# Patient Record
Sex: Male | Born: 1950
Health system: Southern US, Community
[De-identification: ages and names within clinical notes are randomized; demographics above are authoritative.]

## PROBLEM LIST (undated history)

## (undated) DIAGNOSIS — E119 Type 2 diabetes mellitus without complications: Secondary | ICD-10-CM

## (undated) DIAGNOSIS — I10 Essential (primary) hypertension: Secondary | ICD-10-CM

## (undated) DIAGNOSIS — R0600 Dyspnea, unspecified: Secondary | ICD-10-CM

## (undated) HISTORY — PX: BACK SURGERY: SHX140

## (undated) HISTORY — PX: HAND NERVE REPAIR: SHX1728

## (undated) HISTORY — PX: EYE SURGERY: SHX253

---

## 2017-11-29 DIAGNOSIS — Z8669 Personal history of other diseases of the nervous system and sense organs: Secondary | ICD-10-CM | POA: Diagnosis not present

## 2017-11-29 DIAGNOSIS — E785 Hyperlipidemia, unspecified: Secondary | ICD-10-CM | POA: Diagnosis not present

## 2017-11-29 DIAGNOSIS — Z23 Encounter for immunization: Secondary | ICD-10-CM | POA: Diagnosis not present

## 2017-11-29 DIAGNOSIS — I10 Essential (primary) hypertension: Secondary | ICD-10-CM | POA: Diagnosis not present

## 2017-11-29 DIAGNOSIS — E1169 Type 2 diabetes mellitus with other specified complication: Secondary | ICD-10-CM | POA: Diagnosis not present

## 2018-03-06 DIAGNOSIS — D485 Neoplasm of uncertain behavior of skin: Secondary | ICD-10-CM | POA: Diagnosis not present

## 2018-03-06 DIAGNOSIS — M5416 Radiculopathy, lumbar region: Secondary | ICD-10-CM | POA: Diagnosis not present

## 2018-03-06 DIAGNOSIS — M5136 Other intervertebral disc degeneration, lumbar region: Secondary | ICD-10-CM | POA: Diagnosis not present

## 2018-03-06 DIAGNOSIS — M62830 Muscle spasm of back: Secondary | ICD-10-CM | POA: Diagnosis not present

## 2018-03-11 DIAGNOSIS — E119 Type 2 diabetes mellitus without complications: Secondary | ICD-10-CM | POA: Diagnosis not present

## 2018-03-11 DIAGNOSIS — H524 Presbyopia: Secondary | ICD-10-CM | POA: Diagnosis not present

## 2018-04-07 DIAGNOSIS — M5416 Radiculopathy, lumbar region: Secondary | ICD-10-CM | POA: Diagnosis not present

## 2018-04-07 DIAGNOSIS — M5136 Other intervertebral disc degeneration, lumbar region: Secondary | ICD-10-CM | POA: Diagnosis not present

## 2018-04-07 DIAGNOSIS — M62830 Muscle spasm of back: Secondary | ICD-10-CM | POA: Diagnosis not present

## 2018-04-07 DIAGNOSIS — E1169 Type 2 diabetes mellitus with other specified complication: Secondary | ICD-10-CM | POA: Diagnosis not present

## 2018-05-28 DIAGNOSIS — M545 Low back pain: Secondary | ICD-10-CM | POA: Diagnosis not present

## 2018-05-28 DIAGNOSIS — M5136 Other intervertebral disc degeneration, lumbar region: Secondary | ICD-10-CM | POA: Diagnosis not present

## 2018-06-05 DIAGNOSIS — M5416 Radiculopathy, lumbar region: Secondary | ICD-10-CM | POA: Diagnosis not present

## 2018-06-11 DIAGNOSIS — M5416 Radiculopathy, lumbar region: Secondary | ICD-10-CM | POA: Diagnosis not present

## 2018-06-11 DIAGNOSIS — M545 Low back pain: Secondary | ICD-10-CM | POA: Diagnosis not present

## 2018-06-11 DIAGNOSIS — Z135 Encounter for screening for eye and ear disorders: Secondary | ICD-10-CM | POA: Diagnosis not present

## 2018-06-18 DIAGNOSIS — I10 Essential (primary) hypertension: Secondary | ICD-10-CM | POA: Diagnosis not present

## 2018-06-18 DIAGNOSIS — M5416 Radiculopathy, lumbar region: Secondary | ICD-10-CM | POA: Diagnosis not present

## 2018-06-18 DIAGNOSIS — Z6834 Body mass index (BMI) 34.0-34.9, adult: Secondary | ICD-10-CM | POA: Diagnosis not present

## 2018-06-20 ENCOUNTER — Other Ambulatory Visit: Payer: Self-pay | Admitting: Neurological Surgery

## 2018-07-04 DIAGNOSIS — M5416 Radiculopathy, lumbar region: Secondary | ICD-10-CM | POA: Diagnosis not present

## 2018-07-09 NOTE — Progress Notes (Signed)
[id number] - [place], [place] [address] [place] [address] Phone: [phone] Fax: [phone]      Your procedure is scheduled on Monday, 07/14/2018.  Report to [facility] Main Entrance "A" at 10:00 A.M., and check in at the Admitting office.  Call this number if you have problems the morning of surgery:  [phone]  Call [phone] if you have any questions prior to your surgery date Monday-Friday 8am-4pm    Remember:  Do not eat or drink after midnight the night before your surgery     Take these medicines the morning of surgery with A SIP OF WATER: Acetaminophen (Tylenol) - if needed   7 days prior to surgery STOP taking any Aspirin (unless otherwise instructed by your surgeon), Aleve, Naproxen, Ibuprofen, Motrin, Advil, Goody's, BC's, all herbal medications, fish oil, and all vitamins.  WHAT DO I DO ABOUT MY DIABETES MEDICATION?  Marland Kitchen Do not take oral diabetes medicines (pills) the morning of surgery. - Do NOT take your metformin the morning of surgery   How to Manage Your Diabetes Before and After Surgery  Why is it important to control my blood sugar before and after surgery? . Improving blood sugar levels before and after surgery helps healing and can limit problems. . A way of improving blood sugar control is eating a healthy diet by: o  Eating less sugar and carbohydrates o  Increasing activity/exercise o  Talking with your doctor about reaching your blood sugar goals . High blood sugars (greater than 180 mg/dL) can raise your risk of infections and slow your recovery, so you will need to focus on controlling your diabetes during the weeks before surgery. . Make sure that the doctor who takes care of your diabetes knows about your planned surgery including the date and location.  How do I manage my blood sugar before surgery? . Check your blood sugar at least 4 times a day, starting 2 days before  surgery, to make sure that the level is not too high or low. o Check your blood sugar the morning of your surgery when you wake up and every 2 hours until you get to the Short Stay unit. . If your blood sugar is less than 70 mg/dL, you will need to treat for low blood sugar: o Do not take insulin. o Treat a low blood sugar (less than 70 mg/dL) with  cup of clear juice (cranberry or apple), 4 glucose tablets, OR glucose gel. o Recheck blood sugar in 15 minutes after treatment (to make sure it is greater than 70 mg/dL). If your blood sugar is not greater than 70 mg/dL on recheck, call [phone] for further instructions. . Report your blood sugar to the short stay nurse when you get to Short Stay.  . If you are admitted to the hospital after surgery: o Your blood sugar will be checked by the staff and you will probably be given insulin after surgery (instead of oral diabetes medicines) to make sure you have good blood sugar levels. o The goal for blood sugar control after surgery is 80-180 mg/dL.      The Morning of Surgery  Do not wear jewelry, make-up or nail polish.  Do not wear lotions, powders, or perfumes/colognes, or deodorant  Do not shave 48 hours prior to surgery.  Men may shave face and neck.  Do not bring valuables to the hospital.  [facility] is not responsible for  any belongings or valuables.  IF you are a smoker, DO NOT Smoke 24 hours prior to surgery  IF you wear a CPAP at night please bring your mask, tubing, and machine the morning of surgery   Remember that you must have someone to transport you home after your surgery, and remain with you for 24 hours if you are discharged the same day.   Contacts, glasses, hearing aids, dentures or bridgework may not be worn into surgery.    Leave your suitcase in the car.  After surgery it may be brought to your room.  For patients admitted to the hospital, discharge time will be determined by your treatment team.  Patients  discharged the day of surgery will not be allowed to drive home.    Special instructions:   [place]- Preparing For Surgery  Before surgery, you can play an important role. Because skin is not sterile, your skin needs to be as free of germs as possible. You can reduce the number of germs on your skin by washing with CHG (chlorahexidine gluconate) Soap before surgery.  CHG is an antiseptic cleaner which kills germs and bonds with the skin to continue killing germs even after washing.    Oral Hygiene is also important to reduce your risk of infection.  Remember - BRUSH YOUR TEETH THE MORNING OF SURGERY WITH YOUR REGULAR TOOTHPASTE  Please do not use if you have an allergy to CHG or antibacterial soaps. If your skin becomes reddened/irritated stop using the CHG.  Do not shave (including legs and underarms) for at least 48 hours prior to first CHG shower. It is OK to shave your face.  Please follow these instructions carefully.   1. Shower the NIGHT BEFORE SURGERY and the MORNING OF SURGERY with CHG Soap.   2. If you chose to wash your hair, wash your hair first as usual with your normal shampoo.  3. After you shampoo, rinse your hair and body thoroughly to remove the shampoo.  4. Use CHG as you would any other liquid soap. You can apply CHG directly to the skin and wash gently with a scrungie or a clean washcloth.   5. Apply the CHG Soap to your body ONLY FROM THE NECK DOWN.  Do not use on open wounds or open sores. Avoid contact with your eyes, ears, mouth and genitals (private parts). Wash Face and genitals (private parts)  with your normal soap.   6. Wash thoroughly, paying special attention to the area where your surgery will be performed.  7. Thoroughly rinse your body with warm water from the neck down.  8. DO NOT shower/wash with your normal soap after using and rinsing off the CHG Soap.  9. Pat yourself dry with a CLEAN TOWEL.  10. Wear CLEAN PAJAMAS to bed the night before  surgery, wear comfortable clothes the morning of surgery  11. Place CLEAN SHEETS on your bed the night of your first shower and DO NOT SLEEP WITH PETS.    Day of Surgery: Do not apply any deodorants/lotions. Please shower the morning of surgery with the CHG soap  Please wear clean clothes to the hospital/surgery center.   Remember to brush your teeth WITH YOUR REGULAR TOOTHPASTE.   Please read over the following fact sheets that you were given.

## 2018-07-10 ENCOUNTER — Other Ambulatory Visit (HOSPITAL_COMMUNITY)
Admission: RE | Admit: 2018-07-10 | Discharge: 2018-07-10 | Disposition: A | Discharge status: Home / Self Care | Payer: Medicare Other | Source: Ambulatory Visit | Attending: Neurological Surgery | Admitting: Neurological Surgery

## 2018-07-10 ENCOUNTER — Other Ambulatory Visit: Payer: Self-pay

## 2018-07-10 ENCOUNTER — Encounter (HOSPITAL_COMMUNITY)
Admission: RE | Admit: 2018-07-10 | Discharge: 2018-07-10 | Disposition: A | Discharge status: Home / Self Care | Payer: Medicare Other | Source: Ambulatory Visit | Attending: Neurological Surgery | Admitting: Neurological Surgery

## 2018-07-10 ENCOUNTER — Encounter (HOSPITAL_COMMUNITY): Payer: Self-pay

## 2018-07-10 DIAGNOSIS — R9431 Abnormal electrocardiogram [ECG] [EKG]: Secondary | ICD-10-CM | POA: Insufficient documentation

## 2018-07-10 DIAGNOSIS — I444 Left anterior fascicular block: Secondary | ICD-10-CM | POA: Diagnosis not present

## 2018-07-10 DIAGNOSIS — Z1159 Encounter for screening for other viral diseases: Secondary | ICD-10-CM | POA: Insufficient documentation

## 2018-07-10 DIAGNOSIS — I10 Essential (primary) hypertension: Secondary | ICD-10-CM | POA: Insufficient documentation

## 2018-07-10 DIAGNOSIS — Z01818 Encounter for other preprocedural examination: Secondary | ICD-10-CM | POA: Diagnosis not present

## 2018-07-10 DIAGNOSIS — M5126 Other intervertebral disc displacement, lumbar region: Secondary | ICD-10-CM | POA: Diagnosis not present

## 2018-07-10 HISTORY — DX: Essential (primary) hypertension: I10

## 2018-07-10 HISTORY — DX: Type 2 diabetes mellitus without complications: E11.9

## 2018-07-10 HISTORY — DX: Dyspnea, unspecified: R06.00

## 2018-07-10 LAB — CBC
HCT: 45.2 % (ref 39.0–52.0)
Hemoglobin: 15.4 g/dL (ref 13.0–17.0)
MCH: 30.7 pg (ref 26.0–34.0)
MCHC: 34.1 g/dL (ref 30.0–36.0)
MCV: 90 fL (ref 80.0–100.0)
Platelets: 240 10*3/uL (ref 150–400)
RBC: 5.02 MIL/uL (ref 4.22–5.81)
RDW: 12.1 % (ref 11.5–15.5)
WBC: 5.1 10*3/uL (ref 4.0–10.5)
nRBC: 0 % (ref 0.0–0.2)

## 2018-07-10 LAB — BASIC METABOLIC PANEL
Anion gap: 13 (ref 5–15)
BUN: 15 mg/dL (ref 8–23)
CO2: 20 mmol/L — ABNORMAL LOW (ref 22–32)
Calcium: 9.5 mg/dL (ref 8.9–10.3)
Chloride: 106 mmol/L (ref 98–111)
Creatinine, Ser: 1.08 mg/dL (ref 0.61–1.24)
GFR calc Af Amer: >60 mL/min (ref >60)
GFR calc non Af Amer: >60 mL/min (ref >60)
Glucose, Bld: 123 mg/dL — ABNORMAL HIGH (ref 70–99)
Potassium: 4 mmol/L (ref 3.5–5.1)
Sodium: 139 mmol/L (ref 135–145)

## 2018-07-10 LAB — SARS CORONAVIRUS 2 (TAT 6-24 HRS): SARS Coronavirus 2: NEGATIVE

## 2018-07-10 LAB — SURGICAL PCR SCREEN
MRSA, PCR: NEGATIVE
Staphylococcus aureus: POSITIVE — AB

## 2018-07-10 LAB — HEMOGLOBIN A1C
Hgb A1c MFr Bld: 5.9 % — ABNORMAL HIGH (ref 4.8–5.6)
Mean Plasma Glucose: 122.63 mg/dL

## 2018-07-10 LAB — TYPE AND SCREEN
ABO/RH(D): B POS
Antibody Screen: NEGATIVE

## 2018-07-10 LAB — ABO/RH: ABO/RH(D): B POS

## 2018-07-10 LAB — GLUCOSE, CAPILLARY: Glucose-Capillary: 127 mg/dL — ABNORMAL HIGH (ref 70–99)

## 2018-07-10 MED ORDER — CHLORHEXIDINE GLUCONATE CLOTH 2 % EX PADS: 6.0000 | MEDICATED_PAD | Freq: Once | CUTANEOUS | Status: DC

## 2018-07-10 NOTE — Progress Notes (Addendum)
PCP - [address]   AT [place] Cardiologist - NA  Chest x-ray - NA EKG - 07/10/18 st -         Fasting Blood Sugar - 127 Checks Blood Sugar __0___ times a day   Aspirin Instructions:STOP  Anesthesia review: REVIEW  EKG    PT STATED HE DIDN'T SEE MD OR HAVE ANY OTHER EKGS DONE. I DID REQUEST RECORDS AND CLEARANCE,HAD TO LEAVE MESSAGE  Patient denies shortness of breath, fever, cough and chest pain at PAT appointment   Patient verbalized understanding of instructions that were given to them at the PAT appointment. Patient was also instructed that they will need to review over the PAT instructions again at home before surgery.

## 2018-07-10 NOTE — Anesthesia Preprocedure Evaluation (Addendum)
Anesthesia Evaluation  Patient identified by MRN, date of birth, ID band Patient awake    Reviewed: Allergy & Precautions, NPO status , Patient's Chart, lab work & pertinent test results  Airway Mallampati: II  TM Distance: >3 FB Neck ROM: Full    Dental no notable dental hx.    Pulmonary neg pulmonary ROS,    Pulmonary exam normal breath sounds clear to auscultation       Cardiovascular hypertension, Pt. on medications Normal cardiovascular exam Rhythm:Regular Rate:Normal     Neuro/Psych negative neurological ROS  negative psych ROS   GI/Hepatic negative GI ROS, Neg liver ROS,   Endo/Other  diabetes  Renal/GU negative Renal ROS  negative genitourinary   Musculoskeletal negative musculoskeletal ROS (+)   Abdominal   Peds negative pediatric ROS (+)  Hematology negative hematology ROS (+)   Anesthesia Other Findings   Reproductive/Obstetrics negative OB ROS                           Anesthesia Physical Anesthesia Plan  ASA: II  Anesthesia Plan: General   Post-op Pain Management:    Induction: Intravenous  PONV Risk Score and Plan: 2 and Ondansetron, Dexamethasone and Treatment may vary due to age or medical condition  Airway Management Planned: Oral ETT  Additional Equipment:   Intra-op Plan:   Post-operative Plan: Extubation in OR  Informed Consent: I have reviewed the patients History and Physical, chart, labs and discussed the procedure including the risks, benefits and alternatives for the proposed anesthesia with the patient or authorized representative who has indicated his/her understanding and acceptance.     Dental advisory given  Plan Discussed with: CRNA and Surgeon  Anesthesia Plan Comments: (PAT EKG notable for lateral ST-T wave abnormalities. No prior for comparison. Tracing faxed to pt's PCP Dr. Christa See and called to discuss. He reviewed the  tracing and advised pt okay to proceed with surgery as planned. See note on pt chart.)     Anesthesia Quick Evaluation

## 2018-07-14 ENCOUNTER — Encounter (HOSPITAL_COMMUNITY): Payer: Self-pay | Admitting: Surgery

## 2018-07-14 ENCOUNTER — Inpatient Hospital Stay (HOSPITAL_COMMUNITY): Payer: Medicare Other | Admitting: Certified Registered Nurse Anesthetist

## 2018-07-14 ENCOUNTER — Inpatient Hospital Stay (HOSPITAL_COMMUNITY): Payer: Medicare Other

## 2018-07-14 ENCOUNTER — Other Ambulatory Visit: Payer: Self-pay

## 2018-07-14 ENCOUNTER — Encounter (HOSPITAL_COMMUNITY)
Admission: RE | Disposition: A | Discharge status: Home / Self Care | Payer: Self-pay | Source: Home / Self Care | Attending: Neurological Surgery

## 2018-07-14 ENCOUNTER — Inpatient Hospital Stay (HOSPITAL_COMMUNITY): Payer: Medicare Other | Admitting: Physician Assistant

## 2018-07-14 ENCOUNTER — Observation Stay (HOSPITAL_COMMUNITY)
Admission: RE | Admit: 2018-07-14 | Discharge: 2018-07-15 | Disposition: A | Discharge status: Home / Self Care | Payer: Medicare Other | Attending: Neurological Surgery | Admitting: Neurological Surgery

## 2018-07-14 DIAGNOSIS — M532X6 Spinal instabilities, lumbar region: Secondary | ICD-10-CM | POA: Diagnosis not present

## 2018-07-14 DIAGNOSIS — E119 Type 2 diabetes mellitus without complications: Secondary | ICD-10-CM | POA: Insufficient documentation

## 2018-07-14 DIAGNOSIS — I10 Essential (primary) hypertension: Secondary | ICD-10-CM | POA: Diagnosis not present

## 2018-07-14 DIAGNOSIS — M5416 Radiculopathy, lumbar region: Secondary | ICD-10-CM | POA: Diagnosis not present

## 2018-07-14 DIAGNOSIS — M4326 Fusion of spine, lumbar region: Secondary | ICD-10-CM | POA: Diagnosis not present

## 2018-07-14 DIAGNOSIS — M5116 Intervertebral disc disorders with radiculopathy, lumbar region: Principal | ICD-10-CM | POA: Insufficient documentation

## 2018-07-14 DIAGNOSIS — Z419 Encounter for procedure for purposes other than remedying health state, unspecified: Secondary | ICD-10-CM

## 2018-07-14 HISTORY — PX: TRANSFORAMINAL LUMBAR INTERBODY FUSION W/ MIS 1 LEVEL: SHX6145

## 2018-07-14 LAB — GLUCOSE, CAPILLARY
Glucose-Capillary: 103 mg/dL — ABNORMAL HIGH (ref 70–99)
Glucose-Capillary: 85 mg/dL (ref 70–99)
Glucose-Capillary: 135 mg/dL — ABNORMAL HIGH (ref 70–99)
Glucose-Capillary: 164 mg/dL — ABNORMAL HIGH (ref 70–99)

## 2018-07-14 SURGERY — MINIMALLY INVASIVE (MIS) TRANSFORAMINAL LUMBAR INTERBODY FUSION (TLIF) 1 LEVEL
Anesthesia: General | Site: Spine Lumbar | Laterality: Left

## 2018-07-14 MED ORDER — DEXAMETHASONE SODIUM PHOSPHATE 10 MG/ML IJ SOLN
INTRAMUSCULAR | Status: AC
  Filled 2018-07-14: qty 1

## 2018-07-14 MED ORDER — SUCCINYLCHOLINE CHLORIDE 200 MG/10ML IV SOSY
PREFILLED_SYRINGE | INTRAVENOUS | Status: DC | PRN
  Administered 2018-07-14: 120 mg via INTRAVENOUS

## 2018-07-14 MED ORDER — SODIUM CHLORIDE 0.9% FLUSH
3.0000 mL | INTRAVENOUS | Status: DC | PRN
  Administered 2018-07-14: 3 mL via INTRAVENOUS
  Filled 2018-07-14: qty 3

## 2018-07-14 MED ORDER — EPHEDRINE 5 MG/ML INJ
INTRAVENOUS | Status: AC
  Filled 2018-07-14: qty 10

## 2018-07-14 MED ORDER — SUGAMMADEX SODIUM 200 MG/2ML IV SOLN
INTRAVENOUS | Status: DC | PRN
  Administered 2018-07-14: 300 mg via INTRAVENOUS

## 2018-07-14 MED ORDER — SUCCINYLCHOLINE CHLORIDE 200 MG/10ML IV SOSY
PREFILLED_SYRINGE | INTRAVENOUS | Status: AC
  Filled 2018-07-14: qty 10

## 2018-07-14 MED ORDER — THROMBIN 5000 UNITS EX SOLR
OROMUCOSAL | Status: DC | PRN
  Administered 2018-07-14: 5 mL via TOPICAL

## 2018-07-14 MED ORDER — PROPOFOL 10 MG/ML IV BOLUS
INTRAVENOUS | Status: DC | PRN
  Administered 2018-07-14: 150 mg via INTRAVENOUS

## 2018-07-14 MED ORDER — CEFAZOLIN SODIUM-DEXTROSE 2-4 GM/100ML-% IV SOLN
2.0000 g | INTRAVENOUS | Status: AC
  Administered 2018-07-14 (×2): 2 g via INTRAVENOUS
  Filled 2018-07-14: qty 100

## 2018-07-14 MED ORDER — FENTANYL CITRATE (PF) 250 MCG/5ML IJ SOLN
INTRAMUSCULAR | Status: DC | PRN
  Administered 2018-07-14: 25 ug via INTRAVENOUS
  Administered 2018-07-14: 50 ug via INTRAVENOUS
  Administered 2018-07-14: 25 ug via INTRAVENOUS
  Administered 2018-07-14 (×2): 50 ug via INTRAVENOUS
  Administered 2018-07-14: 100 ug via INTRAVENOUS

## 2018-07-14 MED ORDER — DOCUSATE SODIUM 100 MG PO CAPS
100.0000 mg | ORAL_CAPSULE | Freq: Two times a day (BID) | ORAL | Status: DC
  Administered 2018-07-14 – 2018-07-15 (×2): 100 mg via ORAL
  Filled 2018-07-14 (×2): qty 1

## 2018-07-14 MED ORDER — FENTANYL CITRATE (PF) 250 MCG/5ML IJ SOLN
INTRAMUSCULAR | Status: AC
  Filled 2018-07-14: qty 5

## 2018-07-14 MED ORDER — LOSARTAN POTASSIUM 50 MG PO TABS
100.0000 mg | ORAL_TABLET | Freq: Every day | ORAL | Status: DC
  Administered 2018-07-14: 21:00:00 100 mg via ORAL
  Filled 2018-07-14: qty 2

## 2018-07-14 MED ORDER — PROMETHAZINE HCL 25 MG/ML IJ SOLN: 6.2500 mg | INTRAMUSCULAR | Status: DC | PRN

## 2018-07-14 MED ORDER — LACTATED RINGERS IV SOLN
INTRAVENOUS | Status: DC
  Administered 2018-07-14 (×2): via INTRAVENOUS

## 2018-07-14 MED ORDER — CEFAZOLIN SODIUM-DEXTROSE 2-4 GM/100ML-% IV SOLN
2.0000 g | Freq: Three times a day (TID) | INTRAVENOUS | Status: AC
  Administered 2018-07-15 (×2): 2 g via INTRAVENOUS
  Filled 2018-07-14 (×3): qty 100

## 2018-07-14 MED ORDER — SODIUM CHLORIDE 0.9% FLUSH
3.0000 mL | Freq: Two times a day (BID) | INTRAVENOUS | Status: DC
  Administered 2018-07-14: 3 mL via INTRAVENOUS

## 2018-07-14 MED ORDER — MIDAZOLAM HCL 2 MG/2ML IJ SOLN
INTRAMUSCULAR | Status: AC
  Filled 2018-07-14: qty 2

## 2018-07-14 MED ORDER — DEXAMETHASONE SODIUM PHOSPHATE 10 MG/ML IJ SOLN
INTRAMUSCULAR | Status: DC | PRN
  Administered 2018-07-14: 10 mg via INTRAVENOUS

## 2018-07-14 MED ORDER — LIDOCAINE-EPINEPHRINE 1 %-1:100000 IJ SOLN
INTRAMUSCULAR | Status: AC
  Filled 2018-07-14: qty 1

## 2018-07-14 MED ORDER — ONDANSETRON HCL 4 MG/2ML IJ SOLN: 4.0000 mg | Freq: Four times a day (QID) | INTRAMUSCULAR | Status: DC | PRN

## 2018-07-14 MED ORDER — TERAZOSIN HCL 1 MG PO CAPS
2.0000 mg | ORAL_CAPSULE | Freq: Every day | ORAL | Status: DC
  Administered 2018-07-15: 2 mg via ORAL
  Filled 2018-07-14: qty 1
  Filled 2018-07-14 (×3): qty 2

## 2018-07-14 MED ORDER — ALBUMIN HUMAN 5 % IV SOLN
INTRAVENOUS | Status: DC | PRN
  Administered 2018-07-14 (×2): via INTRAVENOUS

## 2018-07-14 MED ORDER — SODIUM CHLORIDE 0.9 % IV SOLN: 250.0000 mL | INTRAVENOUS | Status: DC

## 2018-07-14 MED ORDER — PHENOL 1.4 % MT LIQD: 1.0000 | OROMUCOSAL | Status: DC | PRN

## 2018-07-14 MED ORDER — ONDANSETRON HCL 4 MG/2ML IJ SOLN
INTRAMUSCULAR | Status: DC | PRN
  Administered 2018-07-14: 4 mg via INTRAVENOUS

## 2018-07-14 MED ORDER — METFORMIN HCL 500 MG PO TABS
500.0000 mg | ORAL_TABLET | Freq: Two times a day (BID) | ORAL | Status: DC
  Administered 2018-07-15: 500 mg via ORAL
  Filled 2018-07-14: qty 1

## 2018-07-14 MED ORDER — LIDOCAINE 2% (20 MG/ML) 5 ML SYRINGE
INTRAMUSCULAR | Status: AC
  Filled 2018-07-14: qty 5

## 2018-07-14 MED ORDER — ROCURONIUM BROMIDE 50 MG/5ML IV SOSY
PREFILLED_SYRINGE | INTRAVENOUS | Status: DC | PRN
  Administered 2018-07-14: 50 mg via INTRAVENOUS

## 2018-07-14 MED ORDER — MIDAZOLAM HCL 2 MG/2ML IJ SOLN
INTRAMUSCULAR | Status: DC | PRN
  Administered 2018-07-14: 2 mg via INTRAVENOUS

## 2018-07-14 MED ORDER — ACETAMINOPHEN 650 MG RE SUPP: 650.0000 mg | RECTAL | Status: DC | PRN

## 2018-07-14 MED ORDER — LIDOCAINE-EPINEPHRINE 1 %-1:100000 IJ SOLN
INTRAMUSCULAR | Status: DC | PRN
  Administered 2018-07-14: 8 mL

## 2018-07-14 MED ORDER — OXYCODONE HCL 5 MG PO TABS
5.0000 mg | ORAL_TABLET | ORAL | Status: DC | PRN
  Filled 2018-07-14 (×2): qty 1

## 2018-07-14 MED ORDER — POLYETHYLENE GLYCOL 3350 17 G PO PACK: 17.0000 g | PACK | Freq: Every day | ORAL | Status: DC | PRN

## 2018-07-14 MED ORDER — SODIUM CHLORIDE 0.9 % IV SOLN
INTRAVENOUS | Status: DC
  Administered 2018-07-14: 21:00:00 via INTRAVENOUS

## 2018-07-14 MED ORDER — SODIUM CHLORIDE 0.9 % IV SOLN
INTRAVENOUS | Status: DC | PRN
  Administered 2018-07-14: 13:00:00 25 ug/min via INTRAVENOUS
  Administered 2018-07-14: 18:00:00 via INTRAVENOUS

## 2018-07-14 MED ORDER — ONDANSETRON HCL 4 MG PO TABS: 4.0000 mg | ORAL_TABLET | Freq: Four times a day (QID) | ORAL | Status: DC | PRN

## 2018-07-14 MED ORDER — MENTHOL 3 MG MT LOZG: 1.0000 | LOZENGE | OROMUCOSAL | Status: DC | PRN

## 2018-07-14 MED ORDER — PHENYLEPHRINE 40 MCG/ML (10ML) SYRINGE FOR IV PUSH (FOR BLOOD PRESSURE SUPPORT)
PREFILLED_SYRINGE | INTRAVENOUS | Status: AC
  Filled 2018-07-14: qty 10

## 2018-07-14 MED ORDER — 0.9 % SODIUM CHLORIDE (POUR BTL) OPTIME
TOPICAL | Status: DC | PRN
  Administered 2018-07-14: 12:00:00 1000 mL

## 2018-07-14 MED ORDER — OXYCODONE HCL 5 MG PO TABS
10.0000 mg | ORAL_TABLET | ORAL | Status: DC | PRN
  Administered 2018-07-15 (×2): 10 mg via ORAL
  Filled 2018-07-14: qty 2

## 2018-07-14 MED ORDER — CYCLOBENZAPRINE HCL 10 MG PO TABS
10.0000 mg | ORAL_TABLET | Freq: Three times a day (TID) | ORAL | Status: DC | PRN
  Administered 2018-07-15: 10 mg via ORAL
  Filled 2018-07-14: qty 1

## 2018-07-14 MED ORDER — ROCURONIUM BROMIDE 10 MG/ML (PF) SYRINGE
PREFILLED_SYRINGE | INTRAVENOUS | Status: AC
  Filled 2018-07-14: qty 10

## 2018-07-14 MED ORDER — HYDROMORPHONE HCL 1 MG/ML IJ SOLN: 0.2500 mg | INTRAMUSCULAR | Status: DC | PRN

## 2018-07-14 MED ORDER — SODIUM CHLORIDE 0.9 % IV SOLN
INTRAVENOUS | Status: DC | PRN
  Administered 2018-07-14: 12:00:00 500 mL

## 2018-07-14 MED ORDER — EPHEDRINE SULFATE 50 MG/ML IJ SOLN
INTRAMUSCULAR | Status: DC | PRN
  Administered 2018-07-14: 10 mg via INTRAVENOUS
  Administered 2018-07-14: 15 mg via INTRAVENOUS
  Administered 2018-07-14: 5 mg via INTRAVENOUS
  Administered 2018-07-14 (×2): 10 mg via INTRAVENOUS

## 2018-07-14 MED ORDER — LIDOCAINE 2% (20 MG/ML) 5 ML SYRINGE
INTRAMUSCULAR | Status: DC | PRN
  Administered 2018-07-14: 100 mg via INTRAVENOUS

## 2018-07-14 MED ORDER — THROMBIN 5000 UNITS EX SOLR
CUTANEOUS | Status: AC
  Filled 2018-07-14: qty 5000

## 2018-07-14 MED ORDER — ACETAMINOPHEN 325 MG PO TABS: 650.0000 mg | ORAL_TABLET | ORAL | Status: DC | PRN

## 2018-07-14 MED ORDER — HYDROMORPHONE HCL 1 MG/ML IJ SOLN: 0.5000 mg | INTRAMUSCULAR | Status: DC | PRN

## 2018-07-14 MED ORDER — PHENYLEPHRINE HCL (PRESSORS) 10 MG/ML IV SOLN
INTRAVENOUS | Status: DC | PRN
  Administered 2018-07-14: 120 ug via INTRAVENOUS

## 2018-07-14 MED ORDER — ONDANSETRON HCL 4 MG/2ML IJ SOLN
INTRAMUSCULAR | Status: AC
  Filled 2018-07-14: qty 2

## 2018-07-14 MED ORDER — PHENYLEPHRINE HCL (PRESSORS) 10 MG/ML IV SOLN
INTRAVENOUS | Status: AC
  Filled 2018-07-14: qty 1

## 2018-07-14 SURGICAL SUPPLY — 69 items
BAG DECANTER FOR FLEXI CONT (MISCELLANEOUS) ×3 IMPLANT
BLADE CLIPPER SURG (BLADE) IMPLANT
BLADE SURG 11 STRL SS (BLADE) ×3 IMPLANT
BUR MATCHSTICK NEURO 3.0 LAGG (BURR) ×3 IMPLANT
BUR PRECISION FLUTE 5.0 (BURR) IMPLANT
CONT SPEC 4OZ CLIKSEAL STRL BL (MISCELLANEOUS) ×3 IMPLANT
COVER BACK TABLE 60X90IN (DRAPES) ×3 IMPLANT
COVER WAND RF STERILE (DRAPES) ×3 IMPLANT
DECANTER SPIKE VIAL GLASS SM (MISCELLANEOUS) ×3 IMPLANT
DERMABOND ADVANCED (GAUZE/BANDAGES/DRESSINGS) ×2
DERMABOND ADVANCED .7 DNX12 (GAUZE/BANDAGES/DRESSINGS) ×1 IMPLANT
DRAPE C-ARM 42X72 X-RAY (DRAPES) ×9 IMPLANT
DRAPE C-ARMOR (DRAPES) ×3 IMPLANT
DRAPE LAPAROTOMY 100X72X124 (DRAPES) ×3 IMPLANT
DRAPE MICROSCOPE LEICA (MISCELLANEOUS) ×3 IMPLANT
DRAPE SURG 17X23 STRL (DRAPES) ×6 IMPLANT
ELECT BLADE 6.5 EXT (BLADE) ×3 IMPLANT
ELECT REM PT RETURN 9FT ADLT (ELECTROSURGICAL) ×3
ELECTRODE REM PT RTRN 9FT ADLT (ELECTROSURGICAL) ×1 IMPLANT
GAUZE 4X4 16PLY RFD (DISPOSABLE) IMPLANT
GAUZE SPONGE 4X4 12PLY STRL (GAUZE/BANDAGES/DRESSINGS) ×3 IMPLANT
GLOVE BIO SURGEON STRL SZ 6.5 (GLOVE) ×2 IMPLANT
GLOVE BIO SURGEON STRL SZ7 (GLOVE) ×3 IMPLANT
GLOVE BIO SURGEON STRL SZ7.5 (GLOVE) ×3 IMPLANT
GLOVE BIO SURGEONS STRL SZ 6.5 (GLOVE) ×1
GLOVE BIOGEL PI IND STRL 6.5 (GLOVE) ×1 IMPLANT
GLOVE BIOGEL PI IND STRL 7.5 (GLOVE) ×1 IMPLANT
GLOVE BIOGEL PI IND STRL 8 (GLOVE) ×1 IMPLANT
GLOVE BIOGEL PI INDICATOR 6.5 (GLOVE) ×2
GLOVE BIOGEL PI INDICATOR 7.5 (GLOVE) ×2
GLOVE BIOGEL PI INDICATOR 8 (GLOVE) ×2
GLOVE EXAM NITRILE LRG STRL (GLOVE) IMPLANT
GLOVE EXAM NITRILE XL STR (GLOVE) IMPLANT
GLOVE EXAM NITRILE XS STR PU (GLOVE) IMPLANT
GOWN STRL REUS W/ TWL LRG LVL3 (GOWN DISPOSABLE) ×3 IMPLANT
GOWN STRL REUS W/ TWL XL LVL3 (GOWN DISPOSABLE) IMPLANT
GOWN STRL REUS W/TWL 2XL LVL3 (GOWN DISPOSABLE) ×3 IMPLANT
GOWN STRL REUS W/TWL LRG LVL3 (GOWN DISPOSABLE) ×6
GOWN STRL REUS W/TWL XL LVL3 (GOWN DISPOSABLE)
GUIDEWIRE 18IN BLUNT CD HORIZ (WIRE) ×12 IMPLANT
HEMOSTAT POWDER KIT SURGIFOAM (HEMOSTASIS) ×3 IMPLANT
KIT BASIN OR (CUSTOM PROCEDURE TRAY) ×3 IMPLANT
KIT POSITION SURG JACKSON T1 (MISCELLANEOUS) ×3 IMPLANT
KIT TURNOVER KIT B (KITS) IMPLANT
NEEDLE BEVEL TWO-PAK W/1PK (NEEDLE) ×3 IMPLANT
NEEDLE HYPO 18GX1.5 BLUNT FILL (NEEDLE) ×3 IMPLANT
NEEDLE HYPO 22GX1.5 SAFETY (NEEDLE) ×3 IMPLANT
NEEDLE SPNL 18GX3.5 QUINCKE PK (NEEDLE) ×3 IMPLANT
NS IRRIG 1000ML POUR BTL (IV SOLUTION) ×3 IMPLANT
PACK LAMINECTOMY NEURO (CUSTOM PROCEDURE TRAY) ×3 IMPLANT
PAD ARMBOARD 7.5X6 YLW CONV (MISCELLANEOUS) ×6 IMPLANT
PUTTY DBM GRAFTON 5CC (Putty) ×3 IMPLANT
ROD PERC CCM 5.5X50 (Rod) ×6 IMPLANT
RUBBERBAND STERILE (MISCELLANEOUS) ×6 IMPLANT
SCREW MA FENS 4.5X45 (Screw) ×3 IMPLANT
SCREW MA FENS 5.5X45 (Screw) ×9 IMPLANT
SCREW SET 5.5/6.0MM SOLERA (Screw) ×12 IMPLANT
SPACER LLIF ELEVATE 28X7 (Spacer) ×6 IMPLANT
SPONGE LAP 4X18 RFD (DISPOSABLE) IMPLANT
SUT MNCRL AB 3-0 PS2 18 (SUTURE) ×3 IMPLANT
SUT VIC AB 0 CT1 18XCR BRD8 (SUTURE) IMPLANT
SUT VIC AB 0 CT1 8-18 (SUTURE)
SUT VIC AB 2-0 CP2 18 (SUTURE) ×9 IMPLANT
SYR 3ML LL SCALE MARK (SYRINGE) IMPLANT
SYR CONTROL 10ML LL (SYRINGE) ×3 IMPLANT
TOWEL GREEN STERILE (TOWEL DISPOSABLE) ×3 IMPLANT
TOWEL GREEN STERILE FF (TOWEL DISPOSABLE) ×3 IMPLANT
TRAY FOLEY CATH 16FRSI W/METER (SET/KITS/TRAYS/PACK) ×3 IMPLANT
WATER STERILE IRR 1000ML POUR (IV SOLUTION) ×3 IMPLANT

## 2018-07-14 NOTE — Op Note (Signed)
PATIENT: Kwabena Strutz Kunka  DAY OF SURGERY: 07/14/18   PRE-OPERATIVE DIAGNOSIS:  Lumbar radiculopathy   POST-OPERATIVE DIAGNOSIS:  Same   PROCEDURE:  Left L2-3 minimally invasive transforaminal lumbar interbody fusion, bilateral L2-3 minimally invasive posterolateral instrumented fusion   SURGEON:  Surgeon(s) and Role:    Judith Part, MD - Primary   ANESTHESIA: ETGA   BRIEF HISTORY: This is a 68 year old man who presented with left leg pain, numbness, weakness. The patient was found to have a large disc herniation at L2-3 affecting both the exiting and traversing nerve roots along with a mobile spondylolisthesis at that segment. This was discussed with the patient as well as risks, benefits, and alternatives and wished to proceed with surgical treatment.   OPERATIVE DETAIL: The patient was taken to the operating room and placed on the OR table in the prone position. A formal time out was performed with two patient identifiers and confirmed the operative site. Anesthesia was induced by the anesthesia team. The operative site was marked, hair was clipped with surgical clippers, the area was then prepped and draped in a sterile fashion.   Fluoroscopy was used to identify the L2-3 disc space and an entry point was marked 3cm to the left of midline. An incision was created and serial tubular dilators were used to dock a tubular distractor on the facet complex. Soft tissue was resected, landmarks were identified, and a complete left L2-3 facetectomy was performed with identification of the exiting and traversing nerve roots. The disc space was identified, and while disc work was being performed, a retractor was used to protect the exiting nerve root at all times. An annulotomy was performed and a discectomy was performed. Using fluoroscopy, a trial sizer was used and an expandable cage was selected and packed with autograft. The expandable cage was inserted and then expanded using  fluoroscopy. The rest of the disc space was packed with autograft and allograft to aid with fusion.   Using fluoroscopy, pedicle screws were then inserted on the left at L2-3, using the tubular retractor to visualize the insertion points before using fluoro AP and lateral views to guide a Jamshidi needle followed by K wire. The tube was removed and, using the K-wire and fluoro, the screws were tapped and placed. A rod was inserted and its location was confirmed on fluoroscopy before final tightening.   On the right at L2-3, percutaneous pedicle screws were placed in the same fashion as on the left, a rod was introduced, secured, confirmed with fluoro, and final tightened. All implanted hardware and allograft was [employer]. While putting in the right sided screws, I noticed on AP views that the interbody cage was further lateral than I thought was best. So I went back to the left side and managed to get the inserter device to re-engage with the back of the cage. I collapsed it and pulled it back medially, then advanced in a more ventral direction to allow it to be away from the foramen while still not too far lateral. I then decorticated and laid down bone allograft on the left side to help with posterolateral fusion along with the interbody. After this, I obtained hemostasis on the left side and both incisions were closed in layers. All instrument and sponge counts were correct, the patient was then returned to anesthesia for emergence. No apparent complications at the completion of the procedure.   EBL:  277mL   DRAINS: none   SPECIMENS: none   Marcello Moores  Quinn Plowman, MD 07/14/18 12:25 PM

## 2018-07-14 NOTE — Transfer of Care (Signed)
Immediate Anesthesia Transfer of Care Note  Patient: Alex Fox [facility]  Procedure(s) Performed: Left Lumbar two-three minimally invasive Transforaminal Lumbar Interbody Fusion (Left Spine Lumbar)  Patient Location: PACU  Anesthesia Type:General  Level of Consciousness: awake and patient cooperative  Airway & Oxygen Therapy: Patient Spontanous Breathing and Patient connected to face mask oxygen  Post-op Assessment: Report given to RN, Post -op Vital signs reviewed and stable and Patient moving all extremities X 4  Post vital signs: Reviewed and stable  Last Vitals:  Vitals Value Taken Time  BP 117/85 07/14/18 1824  Temp    Pulse 102 07/14/18 1826  Resp 16 07/14/18 1826  SpO2 99 % 07/14/18 1826  Vitals shown include unvalidated device data.  Last Pain:  Vitals:   07/14/18 1018  TempSrc:   PainSc: 1       Patients Stated Pain Goal: 3 (78/47/84 1282)  Complications: No apparent anesthesia complications

## 2018-07-14 NOTE — Anesthesia Procedure Notes (Signed)
Procedure Name: Intubation Date/Time: 07/14/2018 12:23 PM Performed by: Kathryne Hitch, CRNA Pre-anesthesia Checklist: Patient identified, Emergency Drugs available, Suction available and Patient being monitored Patient Re-evaluated:Patient Re-evaluated prior to induction Oxygen Delivery Method: Circle system utilized Preoxygenation: Pre-oxygenation with 100% oxygen Induction Type: IV induction Ventilation: Mask ventilation without difficulty Laryngoscope Size: Miller and 2 Grade View: Grade I Tube type: Oral Tube size: 7.5 mm Number of attempts: 1 Airway Equipment and Method: Stylet and Oral airway Placement Confirmation: ETT inserted through vocal cords under direct vision,  positive ETCO2 and breath sounds checked- equal and bilateral Secured at: 23 cm Tube secured with: Tape Dental Injury: Teeth and Oropharynx as per pre-operative assessment

## 2018-07-14 NOTE — Progress Notes (Signed)
Neurosurgery Service Post-operative progress note  Assessment & Plan: 68 y.o. man s/p L2-3 MIS TLIF and bilateral pedicle screw fusion. Seen in PACU, exam is full strength in all muscle groups of lower extremities, no significant back pain, is having some parasthesias and numbness in the left medial malleolus that do not rise superiorly to the rest of the leg, could be radiculitis from retraction during cage placement, full strength in L4 myotome. L HIF/KE strength improved from preop with some residual L3 numbness, which is expected. He had some light-headedness when standing with normal VS. If he continues to feel this way, will keep him on tele overnight.  MAANAV KASSABIAN  07/14/18 7:31 PM

## 2018-07-14 NOTE — Brief Op Note (Signed)
07/14/2018  6:24 PM  PATIENT:  Alex Fox  68 y.o. male  PRE-OPERATIVE DIAGNOSIS:  Lumbar instability, lumbar radiculopathy   POST-OPERATIVE DIAGNOSIS:  Lumbar instability, lumbar radiculopathy   PROCEDURE:  Procedure(s) with comments: Left Lumbar two-three minimally invasive Transforaminal Lumbar Interbody Fusion (Left) - Left Lumbar two-three minimally invasive Transforaminal Lumbar Interbody Fusion  SURGEON:  Surgeon(s) and Role:    * Judith Part, MD - Primary  PHYSICIAN ASSISTANT:   ASSISTANTS: none   ANESTHESIA:   general  EBL:  200 mL   BLOOD ADMINISTERED:none  DRAINS: none   LOCAL MEDICATIONS USED:  LIDOCAINE   SPECIMEN:  No Specimen  DISPOSITION OF SPECIMEN:  N/A  COUNTS:  YES  TOURNIQUET:  * No tourniquets in log *  DICTATION: .Note written in EPIC  PLAN OF CARE: Admit for overnight observation  PATIENT DISPOSITION:  PACU - hemodynamically stable.   Delay start of Pharmacological VTE agent (>24hrs) due to surgical blood loss or risk of bleeding: yes

## 2018-07-14 NOTE — H&P (Signed)
Surgical H&P Update  HPI: 68 y.o. man with left leg weakness, here for L2-3 MIS TLIF. Initial symptoms consisted of LLE pain and numbness, which progressed to weakness. Radiographic workup revealed L2-3 mobile spondylolisthesis and severe L sided foraminal and lateral recess stenosis. No changes in health since she was last seen. Still having leg weakness and numbness and wishes to proceed with surgery.  PMHx:  Past Medical History:  Diagnosis Date  . Diabetes mellitus without complication ([place])   . Dyspnea   . Hypertension    FamHx: History reviewed. No pertinent family history. SocHx:  reports that he has never smoked. He does not have any smokeless tobacco history on file. He reports that he does not drink alcohol or use drugs.  Physical Exam: AOx3, PERRL, FS, TM  Strength 5/5 x4 except for 3/5 L HF and 4-/5 KE, SILT except for L L3 distribution  Assesment/Plan: 68 y.o. man with left L2 and L3 radiculopathies and mobile spondyolisthesis, here for L L2-3 TLIF. Risks, benefits, and alternatives discussed and the patient would like to continue with surgery.  -OR today -3C post-op  Judith Part, MD 07/14/18 11:31 AM

## 2018-07-15 DIAGNOSIS — M5116 Intervertebral disc disorders with radiculopathy, lumbar region: Secondary | ICD-10-CM | POA: Diagnosis not present

## 2018-07-15 DIAGNOSIS — E119 Type 2 diabetes mellitus without complications: Secondary | ICD-10-CM | POA: Diagnosis not present

## 2018-07-15 DIAGNOSIS — I10 Essential (primary) hypertension: Secondary | ICD-10-CM | POA: Diagnosis not present

## 2018-07-15 MED ORDER — OXYCODONE HCL 5 MG PO TABS: 5.0000 mg | ORAL_TABLET | ORAL | 0 refills | Status: AC | PRN

## 2018-07-15 NOTE — Progress Notes (Signed)
Patient is discharging home with home health. All discharge paperwork went over with patient. All questions and concerns addressed. IV taken out. 3 in 1 and walker have been delivered to the room. All belongings sent with patient. Patient taken down in wheelchair. [place]

## 2018-07-15 NOTE — Anesthesia Postprocedure Evaluation (Signed)
Anesthesia Post Note  Patient: Alex Fox [facility]  Procedure(s) Performed: Left Lumbar two-three minimally invasive Transforaminal Lumbar Interbody Fusion (Left Spine Lumbar)     Patient location during evaluation: PACU Anesthesia Type: General Level of consciousness: awake and alert Pain management: pain level controlled Vital Signs Assessment: post-procedure vital signs reviewed and stable Respiratory status: spontaneous breathing, nonlabored ventilation, respiratory function stable and patient connected to nasal cannula oxygen Cardiovascular status: blood pressure returned to baseline and stable Postop Assessment: no apparent nausea or vomiting Anesthetic complications: no    Last Vitals:  Vitals:   07/15/18 0700 07/15/18 1100  BP: 137/85 116/73  Pulse: 77 80  Resp: 16 16  Temp: 36.9 C 36.7 C  SpO2: 95% 94%    Last Pain:  Vitals:   07/15/18 1132  TempSrc:   PainSc: 5                  Yazhini Mcaulay S

## 2018-07-15 NOTE — Discharge Instructions (Signed)
Discharge Instructions ° °No restriction in activities, slowly increase your activity back to normal.  ° °Your incision is closed with dermabond (purple glue). This will naturally fall off over the next 1-2 weeks.  ° °Okay to shower on the day of discharge. Use regular soap and water and try to be gentle when cleaning your incision.  ° °Follow up with Dr. Kalayah Leske in 2 weeks after discharge. If you do not already have a discharge appointment, please call his office at [phone] to schedule a follow up appointment. If you have any concerns or questions, please call the office and let us know. °

## 2018-07-15 NOTE — TOC Transition Note (Signed)
Transition of Care [facility]) - CM/SW Discharge Note   Patient Details  Name: Alex Fox MRN: [id number] Date of Birth: 13-Dec-1950  [facility] CM/SW Contact:  Pollie Friar, RN Phone Number: 07/15/2018, 3:02 PM   Clinical Narrative:    Pt discharging home with [facility] services. Dorian Pod with [facility] accepted the [facility] referral.  DME to be delivered to the room. Pt has transportation home.   Final next level of care: Home w Home Health Services Barriers to Discharge: No Barriers Identified   Patient Goals and CMS Choice   CMS Medicare.gov Compare Post Acute Care list provided to:: Patient Choice offered to / list presented to : Patient  Discharge Placement                       Discharge Plan and Services                DME Arranged: 3-N-1, Walker rolling DME Agency: [employer] Date DME Agency Contacted: 07/15/18   Representative spoke with at DME Agency: [place]: PT, OT [place] Agency: [facility] [place] Date [place]: 07/15/18   Representative spoke with at [place]: [place] ([place]) Interventions     Readmission Risk Interventions No flowsheet data found.

## 2018-07-15 NOTE — Discharge Summary (Signed)
Discharge Summary  Date of Admission: 07/14/2018  Date of Discharge: 07/15/18  Attending Physician: Emelda Brothers, MD  Hospital Course: Patient was admitted following an uncomplicated W4-4 TLIF. He was recovered in PACU and transferred to 3W. His hospital course was uncomplicated and the patient was discharged home on 07/15/2018. He will follow up in clinic with me in 2 weeks.  Neurologic exam at discharge:  AOx3, PERRL, EOMI, FS, TM Strength 5/5 x4, SILTx4  Discharge diagnosis: Lumbar radiculopathy  Judith Part, MD 07/15/18 2:06 PM

## 2018-07-15 NOTE — Evaluation (Signed)
Occupational Therapy Evaluation Patient Details Name: Alex Fox MRN: [id number] DOB: 1950-08-10 Today's Date: 07/15/2018    History of Present Illness Pt is a 68 y/o male who presents s/p 1 level TLIF on 07/14/2018. PMH significant for DM, HTN, "nerve reapir" in hand, eye surgery.    Clinical Impression   This 68 y/o male presents with the above. PTA pt reports independence with ADL and mobility. Pt presenting with weakness, some post-op pain, decreased standing balance. He currently requires minA for functional mobility using RW, minA for standing grooming and seated UB ADL, mod-maxA for LB and toileting ADL. Educated pt re: back precautions, safety and compensatory techniques for performing ADL while maintaining precautions. Pt requiring intermittent cues this session for carryover of precautions. He will benefit from continued acute OT services and recommend follow up [facility] services after discharge to maximize his safety and independence with ADL and mobility after return home. Will follow.     Follow Up Recommendations  Home health OT;Supervision/Assistance - 24 hour    Equipment Recommendations  3 in 1 bedside commode(bariatric 3:1)           Precautions / Restrictions Precautions Precautions: Fall;Back Precaution Booklet Issued: Yes (comment) Precaution Comments: Reviewed handout in detail. Pt was cued for precautions during functional mobility.  Required Braces or Orthoses: ("no brace needed" order) Restrictions Weight Bearing Restrictions: No      Mobility Bed Mobility Overal bed mobility: Needs Assistance Bed Mobility: Rolling;Sidelying to Sit Rolling: Supervision Sidelying to sit: Supervision       General bed mobility comments: pt received standing in room with PT  Transfers Overall transfer level: Needs assistance Equipment used: Rolling walker (2 wheeled) Transfers: Sit to/from Stand Sit to Stand: Min assist         General transfer comment:  pt required boosting assist from lower surface height, assist to control descent    Balance Overall balance assessment: Needs assistance Sitting-balance support: Feet supported;No upper extremity supported Sitting balance-Leahy Scale: Fair     Standing balance support: No upper extremity supported Standing balance-Leahy Scale: Poor Standing balance comment: Reliant on UE support at the sink. Pt was unable to stand fully upright without UE support.                            ADL either performed or assessed with clinical judgement   ADL Overall ADL's : Needs assistance/impaired Eating/Feeding: Modified independent;Sitting   Grooming: Wash/dry hands;Minimal assistance;Standing Grooming Details (indicate cue type and reason): assist for standing balance; pt with difficulty maintaining standing balance without UE support, attempting to lean forward onto sink Upper Body Bathing: Min guard;Sitting   Lower Body Bathing: Moderate assistance;Sit to/from stand Lower Body Bathing Details (indicate cue type and reason): due to adhering to back precautions Upper Body Dressing : Set up;Min guard;Sitting   Lower Body Dressing: Moderate assistance;Maximal assistance;Sit to/from stand Lower Body Dressing Details (indicate cue type and reason): minA +2 standing balance Toilet Transfer: Minimal assistance;Ambulation;Grab bars;RW   Toileting- Clothing Manipulation and Hygiene: Moderate assistance;Sit to/from stand Toileting - Clothing Manipulation Details (indicate cue type and reason): requires assist for gown management; pt unable to reach to perform peri-care, verbally educated on toilet aid, pt will benefit from further demonstration/review     Functional mobility during ADLs: Minimal assistance;Rolling walker General ADL Comments: requires min cues to maintain back precautions during functional tasks  Pertinent Vitals/Pain Pain Assessment:  Faces Faces Pain Scale: Hurts little more Pain Location: Incision site Pain Descriptors / Indicators: Operative site guarding Pain Intervention(s): Monitored during session     Hand Dominance     Extremity/Trunk Assessment Upper Extremity Assessment Upper Extremity Assessment: Overall WFL for tasks assessed   Lower Extremity Assessment Lower Extremity Assessment: Defer to PT evaluation LLE Deficits / Details: Decreased strength - pt reports sensation is equal R and L today.    Cervical / Trunk Assessment Cervical / Trunk Assessment: Other exceptions Cervical / Trunk Exceptions: s/p surgery   Communication Communication Communication: No difficulties   Cognition Arousal/Alertness: Awake/alert Behavior During Therapy: WFL for tasks assessed/performed Overall Cognitive Status: Within Functional Limits for tasks assessed                                     General Comments       Exercises     Shoulder Instructions      Home Living Family/patient expects to be discharged to:: Private residence Living Arrangements: Spouse/significant other Available Help at Discharge: Family Type of Home: House Home Access: Stairs to enter Technical brewer of Steps: 2   [place]: One level     Bathroom Shower/Tub: Walk-in shower;Tub/shower unit(walk-in in Restaurant manager, fast food)   Biochemist, clinical: Handicapped Immunologist)     Home Equipment: None          Prior Functioning/Environment Level of Independence: Independent                 OT Problem List: Decreased strength;Decreased range of motion;Decreased activity tolerance;Impaired balance (sitting and/or standing);Decreased safety awareness;Decreased knowledge of use of DME or AE;Decreased knowledge of precautions;Pain      OT Treatment/Interventions: Self-care/ADL training;Therapeutic exercise;Energy conservation;DME and/or AE instruction;Therapeutic activities;Patient/family education;Balance training     OT Goals(Current goals can be found in the care plan section) Acute Rehab OT Goals Patient Stated Goal: Home at d/c OT Goal Formulation: With patient Time For Goal Achievement: 07/29/18 Potential to Achieve Goals: Good  OT Frequency: Min 2X/week   Barriers to D/C:            Co-evaluation PT/OT/SLP Co-Evaluation/Treatment: Yes(overlap with PT)     OT goals addressed during session: ADL's and self-care      AM-PAC OT "6 Clicks" Daily Activity     Outcome Measure Help from another person eating meals?: None Help from another person taking care of personal grooming?: A Little Help from another person toileting, which includes using toliet, bedpan, or urinal?: A Lot Help from another person bathing (including washing, rinsing, drying)?: A Lot Help from another person to put on and taking off regular upper body clothing?: A Little Help from another person to put on and taking off regular lower body clothing?: A Lot 6 Click Score: 16   End of Session Equipment Utilized During Treatment: Gait belt;Rolling walker Nurse Communication: Mobility status  Activity Tolerance: Patient tolerated treatment well Patient left: in chair;with call bell/phone within reach  OT Visit Diagnosis: Unsteadiness on feet (R26.81);Muscle weakness (generalized) (M62.81)                Time: 4166-0630 OT Time Calculation (min): 19 min Charges:  OT General Charges $OT Visit: 1 Visit OT Evaluation $OT Eval Moderate Complexity: [place], OT [employer] Pager [phone] Office [phone]   Raymondo Band 07/15/2018, 2:01 PM

## 2018-07-15 NOTE — Care Management Obs Status (Signed)
[facility] NOTIFICATION   Patient Details  Name: Makhari Dovidio MRN: [id number] Date of Birth: 1950/11/06   Medicare Observation Status Notification Given:  Yes    Pollie Friar, RN 07/15/2018, 2:53 PM

## 2018-07-15 NOTE — Evaluation (Signed)
Physical Therapy Evaluation Patient Details Name: Alex Fox MRN: [id number] DOB: [birth year] Today's Date: 07/15/2018   History of Present Illness  Pt is a 68 y/o male who presents s/p 1 level TLIF on 07/14/2018. PMH significant for DM, HTN, "nerve reapir" in hand, eye surgery.   Clinical Impression  Pt admitted with above diagnosis. Pt currently with functional limitations due to the deficits listed below (see PT Problem List). At the time of PT eval pt was able to perform transfers and ambulation with up to min assist for balance support and safety with RW. Pt slow and guarded this session, voicing a fear of falling. He was educated on precautions. Pt will benefit from skilled PT to increase their independence and safety with mobility to allow discharge to the venue listed below.       Follow Up Recommendations Home health PT;Supervision for mobility/OOB    Equipment Recommendations  Rolling walker with 5" wheels;3in1 (PT)(Bari 3-in-1 if able)    Recommendations for Other Services       Precautions / Restrictions Precautions Precautions: Fall;Back Precaution Booklet Issued: Yes (comment) Precaution Comments: Reviewed handout in detail. Pt was cued for precautions during functional mobility.  Required Braces or Orthoses: ("no brace needed" order) Restrictions Weight Bearing Restrictions: No      Mobility  Bed Mobility Overal bed mobility: Needs Assistance Bed Mobility: Rolling;Sidelying to Sit Rolling: Supervision Sidelying to sit: Supervision       General bed mobility comments: Supervision for safety. Pt was cued for proper log roll technique. Use of rails required.   Transfers Overall transfer level: Needs assistance Equipment used: Rolling walker (2 wheeled) Transfers: Sit to/from Stand Sit to Stand: Min guard;Min assist         General transfer comment: Hands-on guarding for safety as pt powered up to full stand. Initially minor L knee buckle and LOB  requiring min assist to recover.   Ambulation/Gait Ambulation/Gait assistance: Min assist Gait Distance (Feet): 175 Feet Assistive device: Rolling walker (2 wheeled) Gait Pattern/deviations: Step-through pattern;Decreased stride length;Trunk flexed Gait velocity: Decreased Gait velocity interpretation: <1.8 ft/sec, indicate of risk for recurrent falls General Gait Details: Decreased gait speed with guarded posture. Pt appears SOB at the end of gait training.  Stairs            Wheelchair Mobility    Modified Rankin (Stroke Patients Only)       Balance Overall balance assessment: Needs assistance Sitting-balance support: Feet supported;No upper extremity supported Sitting balance-Leahy Scale: Fair     Standing balance support: No upper extremity supported Standing balance-Leahy Scale: Poor Standing balance comment: Reliant on UE support at the sink. Pt was unable to stand fully upright without UE support.                              Pertinent Vitals/Pain Pain Assessment: Faces Faces Pain Scale: Hurts little more Pain Location: Incision site Pain Descriptors / Indicators: Operative site guarding Pain Intervention(s): Monitored during session    Home Living Family/patient expects to be discharged to:: Private residence Living Arrangements: Spouse/significant other Available Help at Discharge: Family Type of Home: House Home Access: Stairs to enter   Technical brewer of Steps: 2 Home Layout: One level Home Equipment: None      Prior Function Level of Independence: Independent               Hand Dominance  Extremity/Trunk Assessment   Upper Extremity Assessment Upper Extremity Assessment: Overall WFL for tasks assessed    Lower Extremity Assessment Lower Extremity Assessment: LLE deficits/detail LLE Deficits / Details: Decreased strength - pt reports sensation is equal R and L today.     Cervical / Trunk  Assessment Cervical / Trunk Assessment: Other exceptions Cervical / Trunk Exceptions: s/p surgery  Communication   Communication: No difficulties  Cognition Arousal/Alertness: Awake/alert Behavior During Therapy: WFL for tasks assessed/performed Overall Cognitive Status: Within Functional Limits for tasks assessed                                        General Comments      Exercises     Assessment/Plan    PT Assessment Patient needs continued PT services  PT Problem List Decreased strength;Decreased activity tolerance;Decreased balance;Decreased mobility;Decreased knowledge of use of DME;Decreased safety awareness;Decreased knowledge of precautions;Pain       PT Treatment Interventions DME instruction;Gait training;Stair training;Functional mobility training;Therapeutic activities;Therapeutic exercise;Neuromuscular re-education;Patient/family education    PT Goals (Current goals can be found in the Care Plan section)  Acute Rehab PT Goals Patient Stated Goal: Home at d/c PT Goal Formulation: With patient Time For Goal Achievement: 07/22/18 Potential to Achieve Goals: Good    Frequency Min 5X/week   Barriers to discharge        Co-evaluation               AM-PAC PT "6 Clicks" Mobility  Outcome Measure Help needed turning from your back to your side while in a flat bed without using bedrails?: A Little Help needed moving from lying on your back to sitting on the side of a flat bed without using bedrails?: A Little Help needed moving to and from a bed to a chair (including a wheelchair)?: A Little Help needed standing up from a chair using your arms (e.g., wheelchair or bedside chair)?: A Little Help needed to walk in hospital room?: A Little Help needed climbing 3-5 steps with a railing? : A Little 6 Click Score: 18    End of Session Equipment Utilized During Treatment: Gait belt Activity Tolerance: Patient tolerated treatment well Patient  left: in chair;with call bell/phone within reach Nurse Communication: Mobility status PT Visit Diagnosis: Unsteadiness on feet (R26.81);Pain;Other symptoms and signs involving the nervous system (R29.898) Pain - part of body: (Back)    Time: 5093-2671 PT Time Calculation (min) (ACUTE ONLY): 34 min   Charges:   PT Evaluation $PT Eval Moderate Complexity: 1 Mod PT Treatments $Gait Training: 8-22 mins        Rolinda Roan, PT, DPT Acute Rehabilitation Services Pager: [phone] Office: [phone]   Thelma Comp 07/15/2018, 1:11 PM

## 2018-07-15 NOTE — Progress Notes (Signed)
Foley was discontinued, patient was able to void.

## 2018-07-15 NOTE — Progress Notes (Signed)
Neurosurgery Service Progress Note  Subjective: No acute events overnight, leg pain resolved, leg numbness resolved, back pain mild to moderate, sore around the incisions  Objective: Vitals:   07/14/18 2030 07/14/18 2302 07/15/18 0312 07/15/18 0700  BP: (!) 152/81 (!) 164/87 (!) 155/88 137/85  Pulse: 94 83 81 77  Resp: 15 15 16 16   Temp: 98.1 F (36.7 C) 97.9 F (36.6 C) 98.3 F (36.8 C) 98.4 F (36.9 C)  TempSrc: Oral Oral Oral Oral  SpO2: 97% 93% 93% 95%  Weight:      Height:       Temp (24hrs), Avg:98 F (36.7 C), Min:97.7 F (36.5 C), Max:98.4 F (36.9 C)  CBC Latest Ref Rng & Units 07/10/2018  WBC 4.0 - 10.5 K/uL 5.1  Hemoglobin 13.0 - 17.0 g/dL 15.4  Hematocrit 39.0 - 52.0 % 45.2  Platelets 150 - 400 K/uL 240   BMP Latest Ref Rng & Units 07/10/2018  Glucose 70 - 99 mg/dL 123(H)  BUN 8 - 23 mg/dL 15  Creatinine 0.61 - 1.24 mg/dL 1.08  Sodium 135 - 145 mmol/L 139  Potassium 3.5 - 5.1 mmol/L 4.0  Chloride 98 - 111 mmol/L 106  CO2 22 - 32 mmol/L 20(L)  Calcium 8.9 - 10.3 mg/dL 9.5    Intake/Output Summary (Last 24 hours) at 07/15/2018 1118 Last data filed at 07/15/2018 0324 Gross per 24 hour  Intake 2200 ml  Output 1325 ml  Net 875 ml    Current Facility-Administered Medications:  .  0.9 %  sodium chloride infusion, 250 mL, Intravenous, Continuous, Xiamara Hulet, Blaike A, MD .  0.9 %  sodium chloride infusion, , Intravenous, Continuous, Judith Part, MD, Last Rate: 75 mL/hr at 07/14/18 2051 .  acetaminophen (TYLENOL) tablet 650 mg, 650 mg, Oral, Q4H PRN **OR** acetaminophen (TYLENOL) suppository 650 mg, 650 mg, Rectal, Q4H PRN, Judith Part, MD .  cyclobenzaprine (FLEXERIL) tablet 10 mg, 10 mg, Oral, TID PRN, Judith Part, MD, 10 mg at 07/15/18 0537 .  docusate sodium (COLACE) capsule 100 mg, 100 mg, Oral, BID, Nakshatra Klose, Joyice Faster, MD, 100 mg at 07/15/18 1024 .  HYDROmorphone (DILAUDID) injection 0.5 mg, 0.5 mg, Intravenous, Q3H PRN, Judith Part, MD .  losartan (COZAAR) tablet 100 mg, 100 mg, Oral, QHS, Chauncey Sciulli, Joyice Faster, MD, 100 mg at 07/14/18 2116 .  menthol-cetylpyridinium (CEPACOL) lozenge 3 mg, 1 lozenge, Oral, PRN **OR** phenol (CHLORASEPTIC) mouth spray 1 spray, 1 spray, Mouth/Throat, PRN, Lennon Richins, Daniyal A, MD .  metFORMIN (GLUCOPHAGE) tablet 500 mg, 500 mg, Oral, BID WC, Patrena Santalucia, Bennie A, MD, 500 mg at 07/15/18 1025 .  ondansetron (ZOFRAN) tablet 4 mg, 4 mg, Oral, Q6H PRN **OR** ondansetron (ZOFRAN) injection 4 mg, 4 mg, Intravenous, Q6H PRN, Christinna Sprung, Ahaan A, MD .  oxyCODONE (Oxy IR/ROXICODONE) immediate release tablet 10 mg, 10 mg, Oral, Q4H PRN, Judith Part, MD, 10 mg at 07/15/18 1032 .  oxyCODONE (Oxy IR/ROXICODONE) immediate release tablet 5 mg, 5 mg, Oral, Q4H PRN, Javaughn Opdahl, Sulaiman A, MD .  polyethylene glycol (MIRALAX / GLYCOLAX) packet 17 g, 17 g, Oral, Daily PRN, Arthi Mcdonald, Capone A, MD .  sodium chloride flush (NS) 0.9 % injection 3 mL, 3 mL, Intravenous, Q12H, Mashelle Busick, Joyice Faster, MD, 3 mL at 07/14/18 2116 .  sodium chloride flush (NS) 0.9 % injection 3 mL, 3 mL, Intravenous, PRN, Judith Part, MD, 3 mL at 07/14/18 2116 .  terazosin (HYTRIN) capsule 2 mg, 2 mg, Oral, QHS, Karia Ehresman, Joyice Faster,  MD, 2 mg at 07/15/18 0043   Physical Exam: AOx3, PERRL, EOMI, FS, Strength 5/5, SILT  Incisions c/d/i x2  Assessment & Plan: 68 y.o. man s/p L2-3 MIS TLIF, recovering well.  -already improved from preop, discharge home today  Alex Fox  07/15/18 11:18 AM

## 2018-07-15 NOTE — Care Management CC44 (Signed)
Condition Code 44 Documentation Completed  Patient Details  Name: Alex Fox MRN: [id number] Date of Birth: 25-Aug-1950   Condition Code 44 given:  Yes Patient signature on Condition Code 44 notice:  Yes Documentation of 2 MD's agreement:  Yes Code 44 added to claim:  Yes    Pollie Friar, RN 07/15/2018, 2:53 PM

## 2018-07-18 ENCOUNTER — Encounter (HOSPITAL_COMMUNITY): Payer: Self-pay | Admitting: Neurological Surgery

## 2018-07-18 DIAGNOSIS — I10 Essential (primary) hypertension: Secondary | ICD-10-CM | POA: Diagnosis not present

## 2018-07-18 DIAGNOSIS — M48061 Spinal stenosis, lumbar region without neurogenic claudication: Secondary | ICD-10-CM | POA: Diagnosis not present

## 2018-07-18 DIAGNOSIS — Z4789 Encounter for other orthopedic aftercare: Secondary | ICD-10-CM | POA: Diagnosis not present

## 2018-07-18 DIAGNOSIS — M5416 Radiculopathy, lumbar region: Secondary | ICD-10-CM | POA: Diagnosis not present

## 2018-07-18 DIAGNOSIS — Z9181 History of falling: Secondary | ICD-10-CM | POA: Diagnosis not present

## 2018-07-18 DIAGNOSIS — E119 Type 2 diabetes mellitus without complications: Secondary | ICD-10-CM | POA: Diagnosis not present

## 2018-07-18 DIAGNOSIS — Z7984 Long term (current) use of oral hypoglycemic drugs: Secondary | ICD-10-CM | POA: Diagnosis not present

## 2018-07-22 DIAGNOSIS — Z4789 Encounter for other orthopedic aftercare: Secondary | ICD-10-CM | POA: Diagnosis not present

## 2018-07-22 DIAGNOSIS — I10 Essential (primary) hypertension: Secondary | ICD-10-CM | POA: Diagnosis not present

## 2018-07-22 DIAGNOSIS — M48061 Spinal stenosis, lumbar region without neurogenic claudication: Secondary | ICD-10-CM | POA: Diagnosis not present

## 2018-07-22 DIAGNOSIS — Z7984 Long term (current) use of oral hypoglycemic drugs: Secondary | ICD-10-CM | POA: Diagnosis not present

## 2018-07-22 DIAGNOSIS — E119 Type 2 diabetes mellitus without complications: Secondary | ICD-10-CM | POA: Diagnosis not present

## 2018-07-22 DIAGNOSIS — M5416 Radiculopathy, lumbar region: Secondary | ICD-10-CM | POA: Diagnosis not present

## 2018-07-23 DIAGNOSIS — Z7984 Long term (current) use of oral hypoglycemic drugs: Secondary | ICD-10-CM | POA: Diagnosis not present

## 2018-07-23 DIAGNOSIS — M48061 Spinal stenosis, lumbar region without neurogenic claudication: Secondary | ICD-10-CM | POA: Diagnosis not present

## 2018-07-23 DIAGNOSIS — M5416 Radiculopathy, lumbar region: Secondary | ICD-10-CM | POA: Diagnosis not present

## 2018-07-23 DIAGNOSIS — E119 Type 2 diabetes mellitus without complications: Secondary | ICD-10-CM | POA: Diagnosis not present

## 2018-07-23 DIAGNOSIS — I10 Essential (primary) hypertension: Secondary | ICD-10-CM | POA: Diagnosis not present

## 2018-07-23 DIAGNOSIS — Z4789 Encounter for other orthopedic aftercare: Secondary | ICD-10-CM | POA: Diagnosis not present

## 2018-07-24 DIAGNOSIS — Z4789 Encounter for other orthopedic aftercare: Secondary | ICD-10-CM | POA: Diagnosis not present

## 2018-07-24 DIAGNOSIS — M48061 Spinal stenosis, lumbar region without neurogenic claudication: Secondary | ICD-10-CM | POA: Diagnosis not present

## 2018-07-24 DIAGNOSIS — Z7984 Long term (current) use of oral hypoglycemic drugs: Secondary | ICD-10-CM | POA: Diagnosis not present

## 2018-07-24 DIAGNOSIS — E119 Type 2 diabetes mellitus without complications: Secondary | ICD-10-CM | POA: Diagnosis not present

## 2018-07-24 DIAGNOSIS — M5416 Radiculopathy, lumbar region: Secondary | ICD-10-CM | POA: Diagnosis not present

## 2018-07-24 DIAGNOSIS — I10 Essential (primary) hypertension: Secondary | ICD-10-CM | POA: Diagnosis not present

## 2018-07-27 DIAGNOSIS — I10 Essential (primary) hypertension: Secondary | ICD-10-CM | POA: Diagnosis not present

## 2018-07-27 DIAGNOSIS — M5416 Radiculopathy, lumbar region: Secondary | ICD-10-CM | POA: Diagnosis not present

## 2018-07-27 DIAGNOSIS — Z4789 Encounter for other orthopedic aftercare: Secondary | ICD-10-CM | POA: Diagnosis not present

## 2018-07-27 DIAGNOSIS — Z7984 Long term (current) use of oral hypoglycemic drugs: Secondary | ICD-10-CM | POA: Diagnosis not present

## 2018-07-27 DIAGNOSIS — E119 Type 2 diabetes mellitus without complications: Secondary | ICD-10-CM | POA: Diagnosis not present

## 2018-07-27 DIAGNOSIS — M48061 Spinal stenosis, lumbar region without neurogenic claudication: Secondary | ICD-10-CM | POA: Diagnosis not present

## 2018-07-30 DIAGNOSIS — I10 Essential (primary) hypertension: Secondary | ICD-10-CM | POA: Diagnosis not present

## 2018-07-30 DIAGNOSIS — M5416 Radiculopathy, lumbar region: Secondary | ICD-10-CM | POA: Diagnosis not present

## 2018-07-30 DIAGNOSIS — Z6833 Body mass index (BMI) 33.0-33.9, adult: Secondary | ICD-10-CM | POA: Diagnosis not present

## 2018-07-30 DIAGNOSIS — M4316 Spondylolisthesis, lumbar region: Secondary | ICD-10-CM | POA: Diagnosis not present

## 2018-07-31 DIAGNOSIS — M48061 Spinal stenosis, lumbar region without neurogenic claudication: Secondary | ICD-10-CM | POA: Diagnosis not present

## 2018-07-31 DIAGNOSIS — Z7984 Long term (current) use of oral hypoglycemic drugs: Secondary | ICD-10-CM | POA: Diagnosis not present

## 2018-07-31 DIAGNOSIS — Z4789 Encounter for other orthopedic aftercare: Secondary | ICD-10-CM | POA: Diagnosis not present

## 2018-07-31 DIAGNOSIS — M5416 Radiculopathy, lumbar region: Secondary | ICD-10-CM | POA: Diagnosis not present

## 2018-07-31 DIAGNOSIS — E119 Type 2 diabetes mellitus without complications: Secondary | ICD-10-CM | POA: Diagnosis not present

## 2018-07-31 DIAGNOSIS — I10 Essential (primary) hypertension: Secondary | ICD-10-CM | POA: Diagnosis not present

## 2018-08-01 DIAGNOSIS — M48061 Spinal stenosis, lumbar region without neurogenic claudication: Secondary | ICD-10-CM | POA: Diagnosis not present

## 2018-08-01 DIAGNOSIS — Z7984 Long term (current) use of oral hypoglycemic drugs: Secondary | ICD-10-CM | POA: Diagnosis not present

## 2018-08-01 DIAGNOSIS — Z4789 Encounter for other orthopedic aftercare: Secondary | ICD-10-CM | POA: Diagnosis not present

## 2018-08-01 DIAGNOSIS — M5416 Radiculopathy, lumbar region: Secondary | ICD-10-CM | POA: Diagnosis not present

## 2018-08-01 DIAGNOSIS — I10 Essential (primary) hypertension: Secondary | ICD-10-CM | POA: Diagnosis not present

## 2018-08-01 DIAGNOSIS — E119 Type 2 diabetes mellitus without complications: Secondary | ICD-10-CM | POA: Diagnosis not present

## 2018-08-04 DIAGNOSIS — E119 Type 2 diabetes mellitus without complications: Secondary | ICD-10-CM | POA: Diagnosis not present

## 2018-08-04 DIAGNOSIS — Z7984 Long term (current) use of oral hypoglycemic drugs: Secondary | ICD-10-CM | POA: Diagnosis not present

## 2018-08-04 DIAGNOSIS — Z4789 Encounter for other orthopedic aftercare: Secondary | ICD-10-CM | POA: Diagnosis not present

## 2018-08-04 DIAGNOSIS — M48061 Spinal stenosis, lumbar region without neurogenic claudication: Secondary | ICD-10-CM | POA: Diagnosis not present

## 2018-08-04 DIAGNOSIS — M5416 Radiculopathy, lumbar region: Secondary | ICD-10-CM | POA: Diagnosis not present

## 2018-08-04 DIAGNOSIS — I10 Essential (primary) hypertension: Secondary | ICD-10-CM | POA: Diagnosis not present

## 2018-08-06 DIAGNOSIS — Z7984 Long term (current) use of oral hypoglycemic drugs: Secondary | ICD-10-CM | POA: Diagnosis not present

## 2018-08-06 DIAGNOSIS — Z4789 Encounter for other orthopedic aftercare: Secondary | ICD-10-CM | POA: Diagnosis not present

## 2018-08-06 DIAGNOSIS — M48061 Spinal stenosis, lumbar region without neurogenic claudication: Secondary | ICD-10-CM | POA: Diagnosis not present

## 2018-08-06 DIAGNOSIS — E119 Type 2 diabetes mellitus without complications: Secondary | ICD-10-CM | POA: Diagnosis not present

## 2018-08-06 DIAGNOSIS — M5416 Radiculopathy, lumbar region: Secondary | ICD-10-CM | POA: Diagnosis not present

## 2018-08-06 DIAGNOSIS — I10 Essential (primary) hypertension: Secondary | ICD-10-CM | POA: Diagnosis not present

## 2018-10-15 DIAGNOSIS — Z23 Encounter for immunization: Secondary | ICD-10-CM | POA: Diagnosis not present

## 2018-11-10 DIAGNOSIS — E1169 Type 2 diabetes mellitus with other specified complication: Secondary | ICD-10-CM | POA: Diagnosis not present

## 2018-11-10 DIAGNOSIS — Z79899 Other long term (current) drug therapy: Secondary | ICD-10-CM | POA: Diagnosis not present

## 2018-11-10 DIAGNOSIS — M539 Dorsopathy, unspecified: Secondary | ICD-10-CM | POA: Diagnosis not present

## 2018-11-10 DIAGNOSIS — E785 Hyperlipidemia, unspecified: Secondary | ICD-10-CM | POA: Diagnosis not present

## 2018-11-10 DIAGNOSIS — I10 Essential (primary) hypertension: Secondary | ICD-10-CM | POA: Diagnosis not present

## 2018-11-24 DIAGNOSIS — B351 Tinea unguium: Secondary | ICD-10-CM | POA: Diagnosis not present

## 2018-12-03 DIAGNOSIS — Z23 Encounter for immunization: Secondary | ICD-10-CM | POA: Diagnosis not present

## 2019-01-05 DIAGNOSIS — Z79899 Other long term (current) drug therapy: Secondary | ICD-10-CM | POA: Diagnosis not present

## 2019-01-05 DIAGNOSIS — E1169 Type 2 diabetes mellitus with other specified complication: Secondary | ICD-10-CM | POA: Diagnosis not present

## 2019-03-19 DIAGNOSIS — B351 Tinea unguium: Secondary | ICD-10-CM | POA: Diagnosis not present

## 2019-03-19 DIAGNOSIS — E119 Type 2 diabetes mellitus without complications: Secondary | ICD-10-CM | POA: Diagnosis not present

## 2019-06-11 DIAGNOSIS — L089 Local infection of the skin and subcutaneous tissue, unspecified: Secondary | ICD-10-CM | POA: Diagnosis not present

## 2019-06-11 DIAGNOSIS — Z6835 Body mass index (BMI) 35.0-35.9, adult: Secondary | ICD-10-CM | POA: Diagnosis not present

## 2019-06-11 DIAGNOSIS — L723 Sebaceous cyst: Secondary | ICD-10-CM | POA: Diagnosis not present

## 2019-06-12 DIAGNOSIS — Z6835 Body mass index (BMI) 35.0-35.9, adult: Secondary | ICD-10-CM | POA: Diagnosis not present

## 2019-06-12 DIAGNOSIS — L089 Local infection of the skin and subcutaneous tissue, unspecified: Secondary | ICD-10-CM | POA: Diagnosis not present

## 2019-06-12 DIAGNOSIS — L723 Sebaceous cyst: Secondary | ICD-10-CM | POA: Diagnosis not present

## 2019-06-16 DIAGNOSIS — Z0000 Encounter for general adult medical examination without abnormal findings: Secondary | ICD-10-CM | POA: Diagnosis not present

## 2019-06-16 DIAGNOSIS — E785 Hyperlipidemia, unspecified: Secondary | ICD-10-CM | POA: Diagnosis not present

## 2019-06-16 DIAGNOSIS — Z8669 Personal history of other diseases of the nervous system and sense organs: Secondary | ICD-10-CM | POA: Diagnosis not present

## 2019-06-16 DIAGNOSIS — E1169 Type 2 diabetes mellitus with other specified complication: Secondary | ICD-10-CM | POA: Diagnosis not present

## 2019-06-16 DIAGNOSIS — M791 Myalgia, unspecified site: Secondary | ICD-10-CM | POA: Diagnosis not present

## 2019-06-19 DIAGNOSIS — Z6835 Body mass index (BMI) 35.0-35.9, adult: Secondary | ICD-10-CM | POA: Diagnosis not present

## 2019-06-19 DIAGNOSIS — Z4802 Encounter for removal of sutures: Secondary | ICD-10-CM | POA: Diagnosis not present

## 2019-06-19 DIAGNOSIS — L723 Sebaceous cyst: Secondary | ICD-10-CM | POA: Diagnosis not present

## 2019-06-19 DIAGNOSIS — L089 Local infection of the skin and subcutaneous tissue, unspecified: Secondary | ICD-10-CM | POA: Diagnosis not present

## 2019-09-22 DIAGNOSIS — B351 Tinea unguium: Secondary | ICD-10-CM | POA: Diagnosis not present

## 2019-09-22 DIAGNOSIS — E119 Type 2 diabetes mellitus without complications: Secondary | ICD-10-CM | POA: Diagnosis not present

## 2019-12-02 DIAGNOSIS — Z23 Encounter for immunization: Secondary | ICD-10-CM | POA: Diagnosis not present

## 2020-02-17 IMAGING — RF LUMBAR SPINE - 2-3 VIEW
1 series · 2 of 2 positions shown · non-contrast
Comparison: None.

CLINICAL DATA: L2-3 TLIF

EXAM:
DG C-ARM 61-120 MIN; LUMBAR SPINE - 2-3 VIEW

[Series 1: run · 2 of 2 slices shown]
[im 1/2]
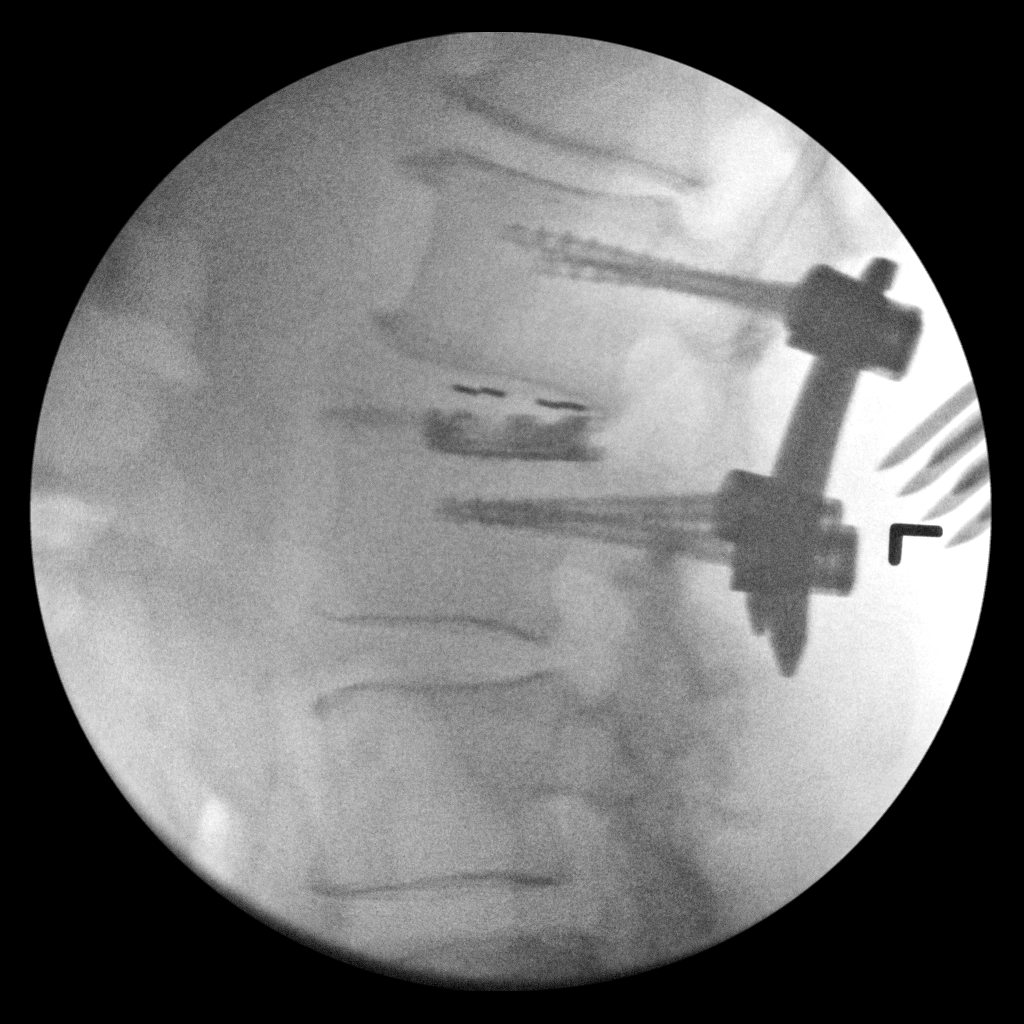
[im 2/2]
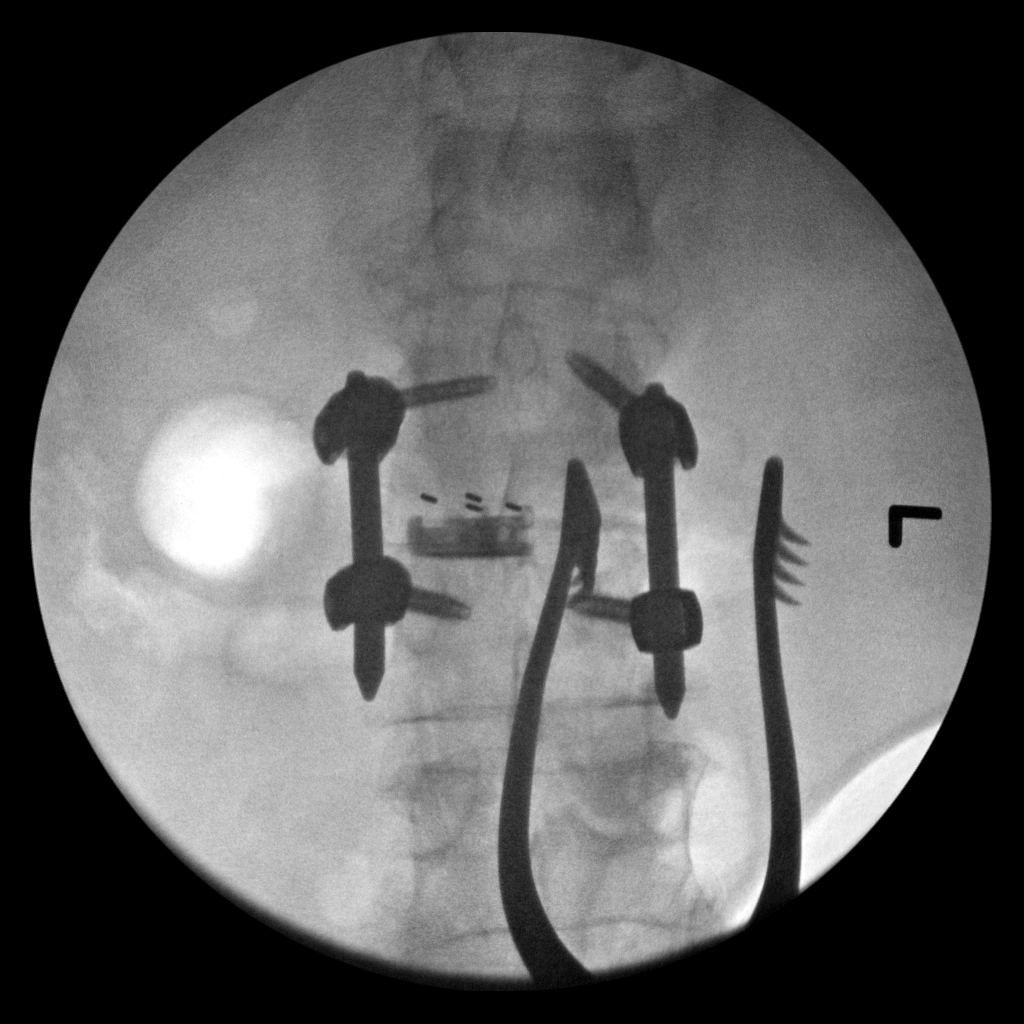

[2 of 2 positions shown; findings below may reference images not displayed]

FLUOROSCOPY TIME:  Radiation Exposure Index (as provided by the
fluoroscopic device): Not available

If the device does not provide the exposure index:

Fluoroscopy Time:  4 minutes 23 seconds

Number of Acquired Images:  2
FINDINGS: Interbody fusion is noted at L2-3 with pedicle screws at both levels
and posterior fixation.
IMPRESSION: L2-3 fusion.

## 2020-03-31 DIAGNOSIS — B351 Tinea unguium: Secondary | ICD-10-CM | POA: Diagnosis not present

## 2020-03-31 DIAGNOSIS — E119 Type 2 diabetes mellitus without complications: Secondary | ICD-10-CM | POA: Diagnosis not present

## 2020-06-21 DIAGNOSIS — Z79899 Other long term (current) drug therapy: Secondary | ICD-10-CM | POA: Diagnosis not present

## 2020-06-21 DIAGNOSIS — Z0000 Encounter for general adult medical examination without abnormal findings: Secondary | ICD-10-CM | POA: Diagnosis not present

## 2020-06-21 DIAGNOSIS — G720 Drug-induced myopathy: Secondary | ICD-10-CM | POA: Diagnosis not present

## 2020-06-21 DIAGNOSIS — E785 Hyperlipidemia, unspecified: Secondary | ICD-10-CM | POA: Diagnosis not present

## 2020-06-21 DIAGNOSIS — E1169 Type 2 diabetes mellitus with other specified complication: Secondary | ICD-10-CM | POA: Diagnosis not present

## 2020-06-21 DIAGNOSIS — I10 Essential (primary) hypertension: Secondary | ICD-10-CM | POA: Diagnosis not present

## 2020-06-21 DIAGNOSIS — T466X5A Adverse effect of antihyperlipidemic and antiarteriosclerotic drugs, initial encounter: Secondary | ICD-10-CM | POA: Diagnosis not present

## 2020-06-27 DIAGNOSIS — Z1212 Encounter for screening for malignant neoplasm of rectum: Secondary | ICD-10-CM | POA: Diagnosis not present

## 2020-06-27 DIAGNOSIS — Z1211 Encounter for screening for malignant neoplasm of colon: Secondary | ICD-10-CM | POA: Diagnosis not present

## 2020-10-04 DIAGNOSIS — E119 Type 2 diabetes mellitus without complications: Secondary | ICD-10-CM | POA: Diagnosis not present

## 2020-10-14 DIAGNOSIS — Z23 Encounter for immunization: Secondary | ICD-10-CM | POA: Diagnosis not present

## 2021-01-27 DIAGNOSIS — G63 Polyneuropathy in diseases classified elsewhere: Secondary | ICD-10-CM | POA: Diagnosis not present

## 2021-01-27 DIAGNOSIS — M5416 Radiculopathy, lumbar region: Secondary | ICD-10-CM | POA: Diagnosis not present

## 2021-01-27 DIAGNOSIS — E1169 Type 2 diabetes mellitus with other specified complication: Secondary | ICD-10-CM | POA: Diagnosis not present

## 2021-01-27 DIAGNOSIS — M47819 Spondylosis without myelopathy or radiculopathy, site unspecified: Secondary | ICD-10-CM | POA: Diagnosis not present

## 2021-01-27 DIAGNOSIS — Z79899 Other long term (current) drug therapy: Secondary | ICD-10-CM | POA: Diagnosis not present

## 2021-01-27 DIAGNOSIS — I70213 Atherosclerosis of native arteries of extremities with intermittent claudication, bilateral legs: Secondary | ICD-10-CM | POA: Diagnosis not present

## 2021-02-02 DIAGNOSIS — R252 Cramp and spasm: Secondary | ICD-10-CM | POA: Diagnosis not present

## 2021-02-02 DIAGNOSIS — I70213 Atherosclerosis of native arteries of extremities with intermittent claudication, bilateral legs: Secondary | ICD-10-CM | POA: Diagnosis not present

## 2021-02-02 DIAGNOSIS — I70203 Unspecified atherosclerosis of native arteries of extremities, bilateral legs: Secondary | ICD-10-CM | POA: Diagnosis not present

## 2021-04-04 DIAGNOSIS — E119 Type 2 diabetes mellitus without complications: Secondary | ICD-10-CM | POA: Diagnosis not present

## 2021-11-24 DIAGNOSIS — Z23 Encounter for immunization: Secondary | ICD-10-CM | POA: Diagnosis not present

## 2022-01-25 DIAGNOSIS — M47819 Spondylosis without myelopathy or radiculopathy, site unspecified: Secondary | ICD-10-CM | POA: Diagnosis not present

## 2022-01-25 DIAGNOSIS — E1169 Type 2 diabetes mellitus with other specified complication: Secondary | ICD-10-CM | POA: Diagnosis not present

## 2022-01-25 DIAGNOSIS — E785 Hyperlipidemia, unspecified: Secondary | ICD-10-CM | POA: Diagnosis not present

## 2022-01-25 DIAGNOSIS — N401 Enlarged prostate with lower urinary tract symptoms: Secondary | ICD-10-CM | POA: Diagnosis not present

## 2022-01-25 DIAGNOSIS — N138 Other obstructive and reflux uropathy: Secondary | ICD-10-CM | POA: Diagnosis not present

## 2022-01-25 DIAGNOSIS — M5416 Radiculopathy, lumbar region: Secondary | ICD-10-CM | POA: Diagnosis not present

## 2022-01-25 DIAGNOSIS — Z0000 Encounter for general adult medical examination without abnormal findings: Secondary | ICD-10-CM | POA: Diagnosis not present

## 2022-03-23 DIAGNOSIS — K047 Periapical abscess without sinus: Secondary | ICD-10-CM | POA: Diagnosis not present

## 2022-03-23 DIAGNOSIS — R220 Localized swelling, mass and lump, head: Secondary | ICD-10-CM | POA: Diagnosis not present

## 2022-03-23 DIAGNOSIS — L03211 Cellulitis of face: Secondary | ICD-10-CM | POA: Diagnosis not present

## 2022-04-05 DIAGNOSIS — E119 Type 2 diabetes mellitus without complications: Secondary | ICD-10-CM | POA: Diagnosis not present

## 2022-04-05 DIAGNOSIS — E1142 Type 2 diabetes mellitus with diabetic polyneuropathy: Secondary | ICD-10-CM | POA: Diagnosis not present

## 2022-11-16 DIAGNOSIS — Z23 Encounter for immunization: Secondary | ICD-10-CM | POA: Diagnosis not present

## 2023-04-05 DIAGNOSIS — E785 Hyperlipidemia, unspecified: Secondary | ICD-10-CM | POA: Diagnosis not present

## 2023-04-05 DIAGNOSIS — N138 Other obstructive and reflux uropathy: Secondary | ICD-10-CM | POA: Diagnosis not present

## 2023-04-05 DIAGNOSIS — Z0000 Encounter for general adult medical examination without abnormal findings: Secondary | ICD-10-CM | POA: Diagnosis not present

## 2023-04-05 DIAGNOSIS — E1169 Type 2 diabetes mellitus with other specified complication: Secondary | ICD-10-CM | POA: Diagnosis not present

## 2023-04-05 DIAGNOSIS — Z79899 Other long term (current) drug therapy: Secondary | ICD-10-CM | POA: Diagnosis not present

## 2023-04-05 DIAGNOSIS — N401 Enlarged prostate with lower urinary tract symptoms: Secondary | ICD-10-CM | POA: Diagnosis not present

## 2023-04-05 DIAGNOSIS — I10 Essential (primary) hypertension: Secondary | ICD-10-CM | POA: Diagnosis not present

## 2023-04-05 DIAGNOSIS — R300 Dysuria: Secondary | ICD-10-CM | POA: Diagnosis not present

## 2023-04-05 DIAGNOSIS — G63 Polyneuropathy in diseases classified elsewhere: Secondary | ICD-10-CM | POA: Diagnosis not present

## 2023-04-09 DIAGNOSIS — L608 Other nail disorders: Secondary | ICD-10-CM | POA: Diagnosis not present

## 2023-04-09 DIAGNOSIS — E1142 Type 2 diabetes mellitus with diabetic polyneuropathy: Secondary | ICD-10-CM | POA: Diagnosis not present

## 2023-07-04 DIAGNOSIS — Z1211 Encounter for screening for malignant neoplasm of colon: Secondary | ICD-10-CM | POA: Diagnosis not present

## 2023-10-16 DIAGNOSIS — K219 Gastro-esophageal reflux disease without esophagitis: Secondary | ICD-10-CM | POA: Diagnosis not present

## 2023-11-05 DIAGNOSIS — E669 Obesity, unspecified: Secondary | ICD-10-CM | POA: Diagnosis not present

## 2023-11-05 DIAGNOSIS — R195 Other fecal abnormalities: Secondary | ICD-10-CM | POA: Diagnosis not present

## 2023-11-05 DIAGNOSIS — D126 Benign neoplasm of colon, unspecified: Secondary | ICD-10-CM | POA: Diagnosis not present

## 2023-11-05 DIAGNOSIS — D122 Benign neoplasm of ascending colon: Secondary | ICD-10-CM | POA: Diagnosis not present

## 2023-11-05 DIAGNOSIS — K635 Polyp of colon: Secondary | ICD-10-CM | POA: Diagnosis not present

## 2023-11-05 DIAGNOSIS — I10 Essential (primary) hypertension: Secondary | ICD-10-CM | POA: Diagnosis not present

## 2023-11-05 DIAGNOSIS — Z6832 Body mass index (BMI) 32.0-32.9, adult: Secondary | ICD-10-CM | POA: Diagnosis not present

## 2023-11-05 DIAGNOSIS — Z79899 Other long term (current) drug therapy: Secondary | ICD-10-CM | POA: Diagnosis not present

## 2023-11-05 DIAGNOSIS — Z1211 Encounter for screening for malignant neoplasm of colon: Secondary | ICD-10-CM | POA: Diagnosis not present
